# Patient Record
Sex: Male | Born: 1960 | Race: White | Hispanic: No | Marital: Married | Smoking: Former smoker
Health system: Southern US, Community
[De-identification: ages and names within clinical notes are randomized; demographics above are authoritative.]

## PROBLEM LIST (undated history)

## (undated) DIAGNOSIS — I10 Essential (primary) hypertension: Secondary | ICD-10-CM

## (undated) HISTORY — PX: TONSILLECTOMY: SUR1361

## (undated) HISTORY — PX: BACK SURGERY: SHX140

## (undated) HISTORY — PX: EYE SURGERY: SHX253

---

## 2018-10-20 ENCOUNTER — Other Ambulatory Visit: Payer: Self-pay

## 2018-10-20 ENCOUNTER — Encounter: Payer: Self-pay | Admitting: Emergency Medicine

## 2018-10-20 ENCOUNTER — Emergency Department
Admission: EM | Admit: 2018-10-20 | Discharge: 2018-10-20 | Disposition: A | Discharge status: Home / Self Care | Payer: Managed Care, Other (non HMO) | Source: Home / Self Care | Attending: Family Medicine | Admitting: Family Medicine

## 2018-10-20 DIAGNOSIS — L03115 Cellulitis of right lower limb: Secondary | ICD-10-CM

## 2018-10-20 HISTORY — DX: Essential (primary) hypertension: I10

## 2018-10-20 LAB — POCT CBC W AUTO DIFF (K'VILLE URGENT CARE)

## 2018-10-20 LAB — D-DIMER, QUANTITATIVE: D-Dimer, Quant: 0.81 mcg/mL FEU — ABNORMAL HIGH (ref <0.50)

## 2018-10-20 MED ORDER — CLINDAMYCIN HCL 300 MG PO CAPS: ORAL_CAPSULE | ORAL | 0 refills | Status: AC

## 2018-10-20 NOTE — ED Triage Notes (Signed)
Patient reports 2 day history of right lower leg edema and rash; does not itch or hurt but does feel tight. No known insect bite but has been working around house and yard. Has not travelled.

## 2018-10-20 NOTE — Discharge Instructions (Signed)
Elevate leg whenever possible.  May apply heating pad several times daily. If symptoms become significantly worse during the night or over the weekend, proceed to the local emergency room.

## 2018-10-20 NOTE — ED Provider Notes (Addendum)
[facility]    CSN: [id number] Arrival date & time: 10/20/18  1449     History   Chief Complaint Chief Complaint  Patient presents with  . Rash    HPI Michael Becker is a 58 y.o. male.   Patient complains of right lower leg swelling, redness, and warmth for about two days with minimal pain.  He denies injury or contact with plants.  However, he does recall that he was installing brick pavers about 1+ week ago, sitting on gravel and dragging his right leg along while he fitted the pavers.  He had no immediate irritation or erythema at that time.  He denies fevers, chills, and sweats, shortness of breath, and chest pain.  He denies past history of DVT or PE.   Rash  Location:  Leg Leg rash location:  R lower leg Quality: dryness, redness and swelling   Quality: not blistering, not bruising, not burning, not draining, not itchy, not painful, not peeling, not scaling and not weeping   Severity:  Moderate Onset quality:  Sudden Duration:  2 days Timing:  Constant Progression:  Worsening Chronicity:  New Context: not animal contact, not chemical exposure, not exposure to similar rash, not hot tub use, not insect bite/sting and not plant contact   Relieved by:  None tried Worsened by:  Nothing Ineffective treatments:  None tried Associated symptoms: no abdominal pain, no fatigue, no fever, no headaches, no induration, no joint pain, no myalgias, no nausea, no shortness of breath and not wheezing     Past Medical History:  Diagnosis Date  . Hypertension     Active problem:  hypertension    Past Surgical History:  Procedure Laterality Date  . BACK SURGERY    . EYE SURGERY    . TONSILLECTOMY         Home Medications    Prior to Admission medications   Medication Sig Start Date End Date Taking? Authorizing Provider  losartan (COZAAR) 25 MG tablet Take 25 mg by mouth daily.   Yes [provider]  clindamycin (CLEOCIN) 300 MG capsule Take one cap PO  every 8 hours 10/20/18   Lattie HawBeese,  A, MD    Family History Hypertension:  Father and brother  Social History Social History   Tobacco Use  . Smoking status: Former Games developermoker  . Smokeless tobacco: Never Used  Substance Use Topics  . Alcohol use: Yes  . Drug use: Not on file     Allergies   Penicillins   Review of Systems Review of Systems  Constitutional: Negative for activity change, appetite change, chills, diaphoresis, fatigue and fever.  HENT: Negative.   Eyes: Negative.   Respiratory: Negative for cough, chest tightness, shortness of breath, wheezing and stridor.   Cardiovascular: Positive for leg swelling. Negative for chest pain and palpitations.  Gastrointestinal: Negative for abdominal pain and nausea.  Genitourinary: Negative.   Musculoskeletal: Negative for arthralgias, joint swelling and myalgias.  Skin: Positive for color change and rash.  Neurological: Negative for headaches.     Physical Exam Triage Vital Signs ED Triage Vitals  Enc Vitals Group     BP 10/20/18 1541 124/82     Pulse Rate 10/20/18 1541 (!) 102     Resp 10/20/18 1541 18     Temp 10/20/18 1541 100.1 F (37.8 C)     Temp Source 10/20/18 1541 Oral     SpO2 10/20/18 1541 97 %     Weight 10/20/18 1542 300 lb (136.1  kg)     Height 10/20/18 1542  (1.981 m)     Head Circumference --      Peak Flow --      Pain Score 10/20/18 1542 0     Pain Loc --      Pain Edu? --      Excl. in GC? --    No data found.  Updated Vital Signs BP 124/82 (BP Location: Right Arm)   Pulse (!) 102   Temp 100.1 F (37.8 C) (Oral)   Resp 18   Ht  (1.981 m)   Wt 136.1 kg   SpO2 97%   BMI 34.67 kg/m   Visual Acuity Right Eye Distance:   Left Eye Distance:   Bilateral Distance:    Right Eye Near:   Left Eye Near:    Bilateral Near:     Physical Exam Vitals signs and nursing note reviewed.  Constitutional:      General: He is not in acute distress. HENT:     Head: Normocephalic.      Right Ear: External ear normal.     Left Ear: External ear normal.     Nose: Nose normal.     Mouth/Throat:     Pharynx: Oropharynx is clear.  Eyes:     Pupils: Pupils are equal, round, and reactive to light.  Cardiovascular:     Rate and Rhythm: Tachycardia present.     Heart sounds: Normal heart sounds.  Pulmonary:     Breath sounds: Normal breath sounds.  Abdominal:     Palpations: Abdomen is soft.     Tenderness: There is no abdominal tenderness.  Musculoskeletal:     Right lower leg: He exhibits tenderness and swelling. He exhibits no bony tenderness. Edema present.       Legs:     Comments: Right lower leg has erythema, warmth, and mild tenderness to palpation in distribution as noted on diagram.  There is edema present, primarily above the ankle.  There is tenderness to palpation over the right anterior compartments, and to a lesser extent the posterior calf.  Homan's test negative.  Pedal pulses intact.  Lymphadenopathy:     Cervical: No cervical adenopathy.  Skin:    General: Skin is dry.     Findings: Rash present.  Neurological:     Mental Status: He is alert.      UC Treatments / Results  Labs (all labs ordered are listed, but only abnormal results are displayed) Labs Reviewed  D-DIMER, QUANTITATIVE (NOT AT [facility])  POCT CBC W AUTO DIFF ([facility]):  WBC 16.0; LY 8.2; MO 1.1; GR 90.7; Hgb 14.5; Platelets 209     EKG None  Radiology No results found.  Procedures Procedures (including critical care time)  Medications Ordered in UC Medications - No data to display  Initial Impression / Assessment and Plan / UC Course  I have reviewed the triage vital signs and the nursing notes.  Pertinent labs & imaging results that were available during my care of the patient were reviewed by me and considered in my medical decision making (see chart for details).    Note leukocytosis (WBC16.0).  Begin clindamycin  Q8hr. Consider possible DVT; will  check d-dimer, then obtain US right lower leg if positive. Followup with Family Doctor if not improved in two days   Final Clinical Impressions(s) / UC Diagnoses   Final diagnoses:  Cellulitis of leg, right     Discharge Instructions  Elevate leg whenever possible.  May apply heating pad several times daily. If symptoms become significantly worse during the night or over the weekend, proceed to the local emergency room.     ED Prescriptions    Medication Sig Dispense Auth. Provider   clindamycin (CLEOCIN) 300 MG capsule Take one cap PO every 8 hours 30 capsule Lattie Haw, MD         Lattie Haw, MD 10/21/18 1115  Addendum:  D-dimer elevated:  0.81 mcg/ml Patient notified. Schedule [place] right lower extremity at [facility] today.   Lattie Haw, MD 10/21/18 [phone]

## 2018-10-21 ENCOUNTER — Ambulatory Visit (HOSPITAL_BASED_OUTPATIENT_CLINIC_OR_DEPARTMENT_OTHER)
Admission: RE | Admit: 2018-10-21 | Discharge: 2018-10-21 | Disposition: A | Discharge status: Home / Self Care | Payer: Managed Care, Other (non HMO) | Source: Ambulatory Visit | Attending: Family Medicine | Admitting: Family Medicine

## 2018-10-21 ENCOUNTER — Telehealth: Payer: Self-pay | Admitting: Family Medicine

## 2018-10-21 ENCOUNTER — Other Ambulatory Visit: Payer: Self-pay

## 2018-10-21 DIAGNOSIS — R600 Localized edema: Secondary | ICD-10-CM | POA: Diagnosis not present

## 2018-10-21 DIAGNOSIS — M79604 Pain in right leg: Secondary | ICD-10-CM | POA: Insufficient documentation

## 2018-10-21 DIAGNOSIS — R590 Localized enlarged lymph nodes: Secondary | ICD-10-CM | POA: Diagnosis not present

## 2018-10-21 NOTE — Telephone Encounter (Signed)
Phone call to patient:  Reports no improvement in right lower leg.  D-dimer elevated.  PLAN:  Schedule [place] right lower leg, rule out DVT.

## 2020-04-03 IMAGING — US RIGHT LOWER EXTREMITY VENOUS ULTRASOUND
1 series · 13 of 24 positions shown · non-contrast
Comparison: None.

CLINICAL DATA: Right lower extremity pain and edema.



[Series 1: right lower extremity venous ultrasound · 13 of 38 slices shown]
[im 1/38]
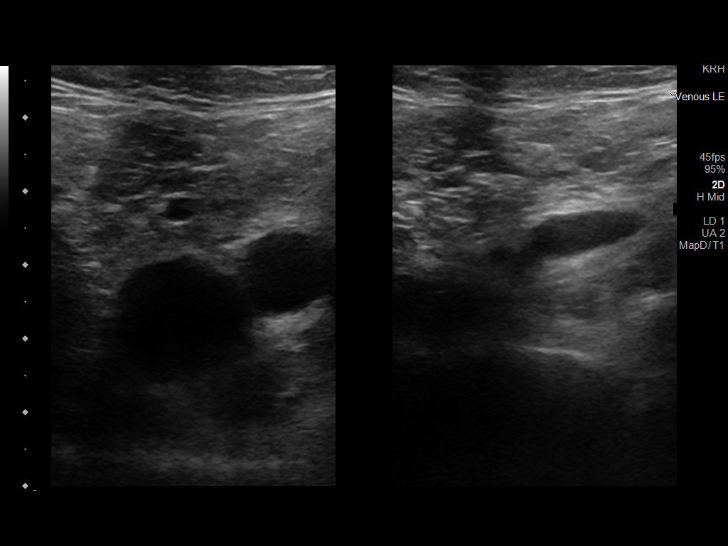
[im 4/38]
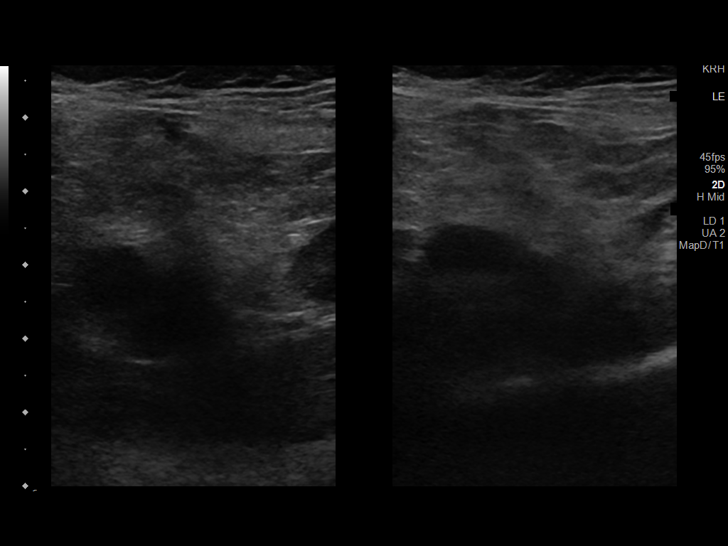
[im 7/38]
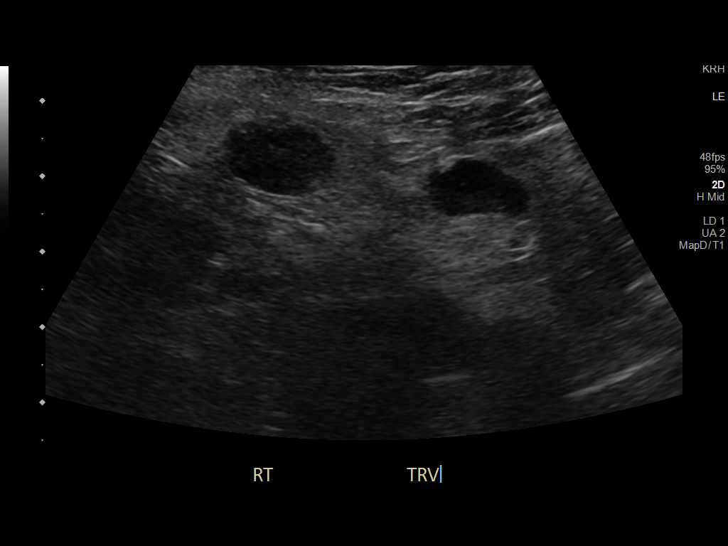
[im 10/38]
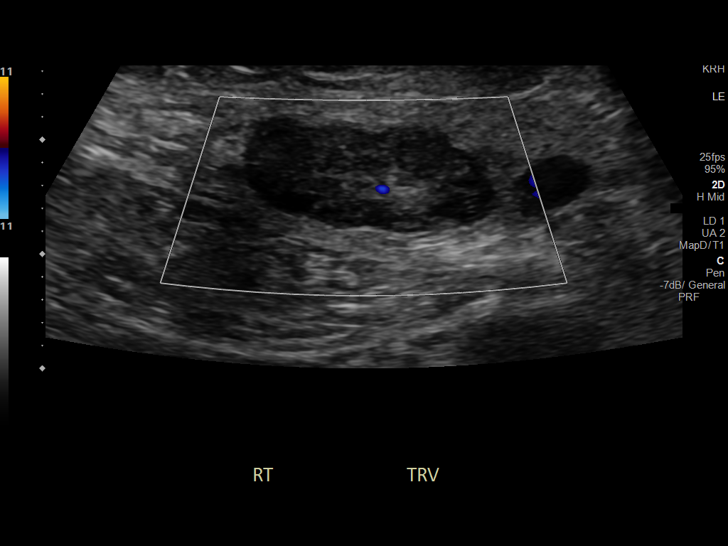
[im 13/38]
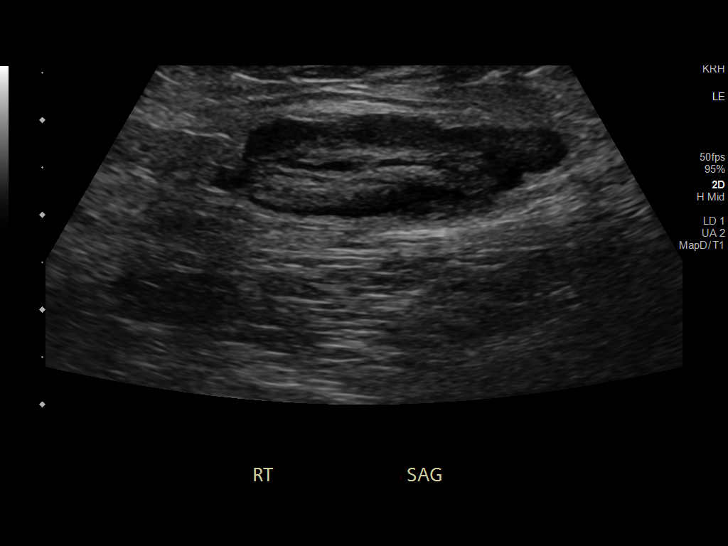
[im 17/38]
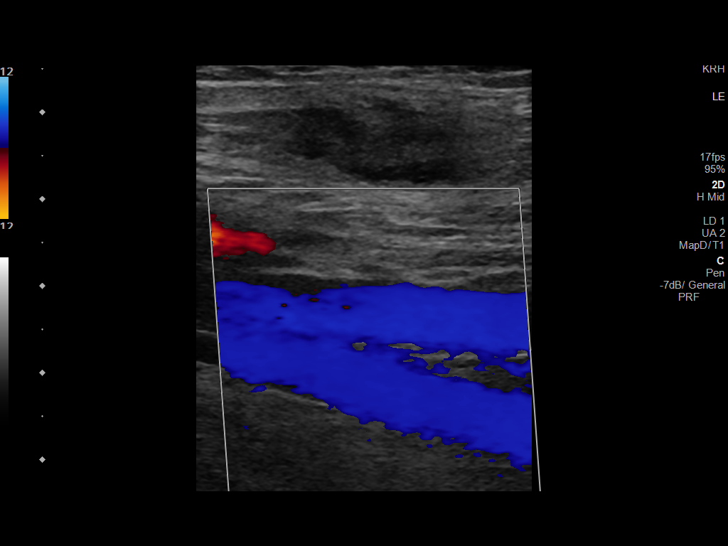
[im 20/38]
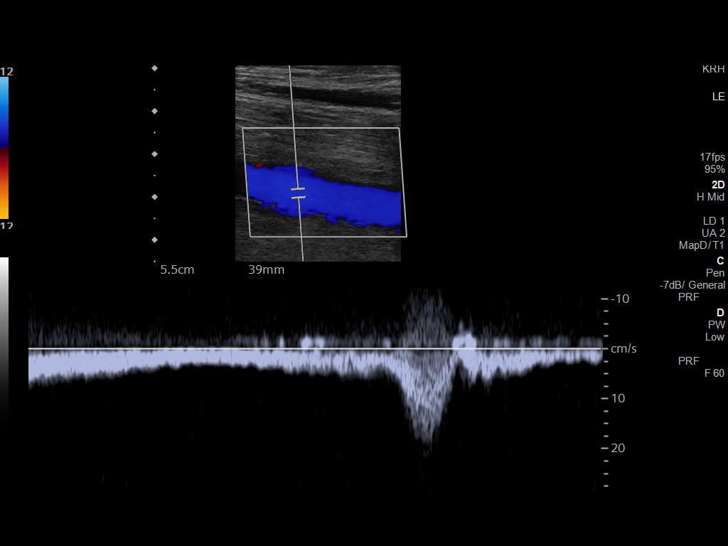
[im 21/38]
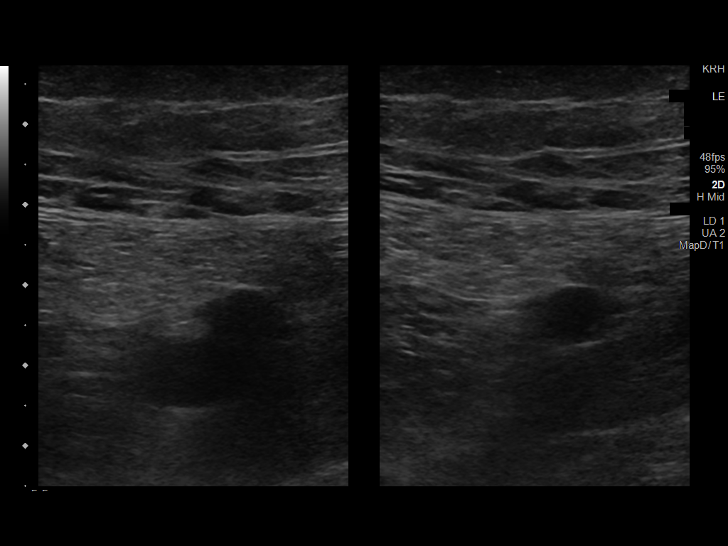
[im 25/38]
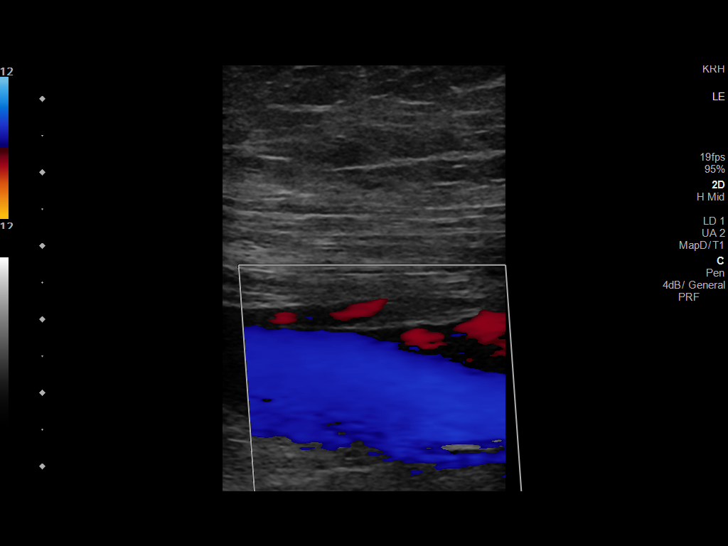
[im 28/38]
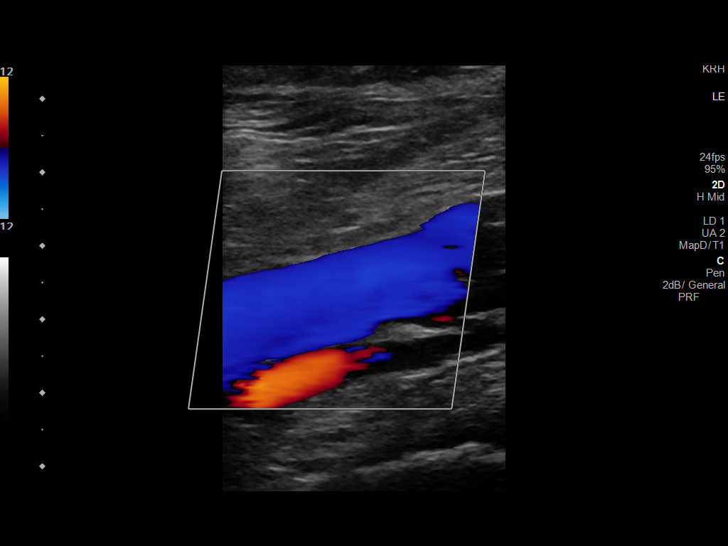
[im 31/38]
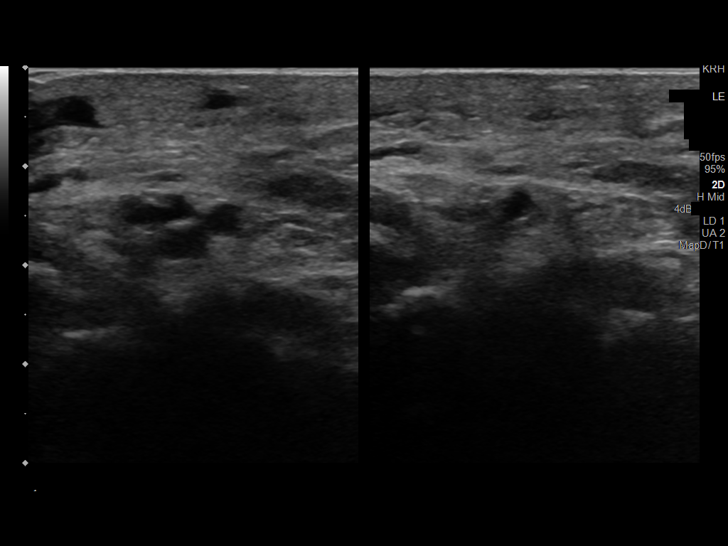
[im 34/38]
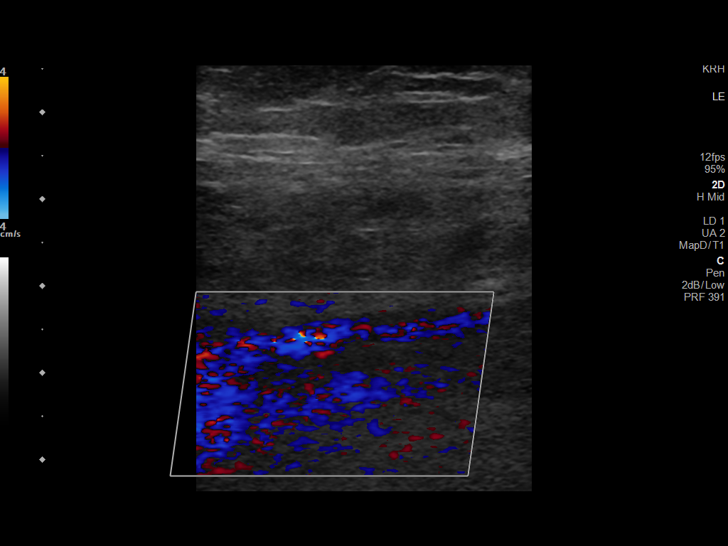
[im 38/38]
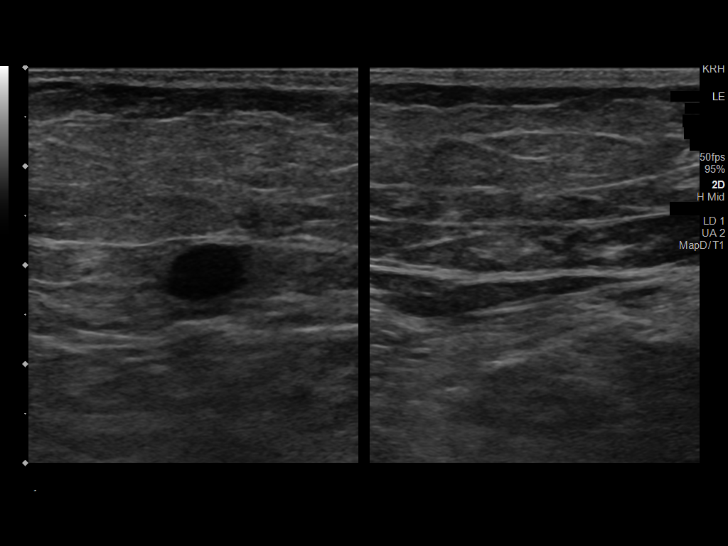

[13 of 24 positions shown; findings below may reference images not displayed]

FINDINGS: Contralateral Common Femoral Vein: Respiratory phasicity is normal
and symmetric with the symptomatic side. No evidence of thrombus.
Normal compressibility.

Common Femoral Vein: No evidence of thrombus. Normal
compressibility, respiratory phasicity and response to augmentation.

Saphenofemoral Junction: No evidence of thrombus. Normal
compressibility and flow on color Doppler imaging.

Profunda Femoral Vein: No evidence of thrombus. Normal
compressibility and flow on color Doppler imaging.

Femoral Vein: No evidence of thrombus. Normal compressibility,
respiratory phasicity and response to augmentation.

Popliteal Vein: No evidence of thrombus. Normal compressibility,
respiratory phasicity and response to augmentation.

Calf Veins: No evidence of thrombus. Normal compressibility and flow
on color Doppler imaging.

Superficial Great Saphenous Vein: No evidence of thrombus. Normal
compressibility.

Venous Reflux:  None.

Other Findings: No evidence of superficial thrombophlebitis or
abnormal fluid collection. There are some mildly prominent right
inguinal lymph nodes which are not grossly enlarged by short axis
measurement but may be reactive nodes.
IMPRESSION: 1. No evidence of right lower extremity deep venous thrombosis.
2. Mildly prominent right inguinal lymph nodes may be reactive.
